# Patient Record
Sex: Male | Born: 1955 | Race: White | Hispanic: No | Marital: Married | Smoking: Never smoker
Health system: Southern US, Community
[De-identification: ages and names within clinical notes are randomized; demographics above are authoritative.]

## PROBLEM LIST (undated history)

## (undated) HISTORY — PX: CHOLECYSTECTOMY: SHX55

## (undated) HISTORY — PX: HERNIA REPAIR: SHX51

---

## 2013-04-21 ENCOUNTER — Encounter (HOSPITAL_BASED_OUTPATIENT_CLINIC_OR_DEPARTMENT_OTHER): Payer: Self-pay | Admitting: Emergency Medicine

## 2013-04-21 ENCOUNTER — Emergency Department (HOSPITAL_BASED_OUTPATIENT_CLINIC_OR_DEPARTMENT_OTHER): Payer: Worker's Compensation

## 2013-04-21 ENCOUNTER — Emergency Department (HOSPITAL_BASED_OUTPATIENT_CLINIC_OR_DEPARTMENT_OTHER)
Admission: EM | Admit: 2013-04-21 | Discharge: 2013-04-21 | Disposition: A | Discharge status: Home / Self Care | Payer: Worker's Compensation | Attending: Emergency Medicine | Admitting: Emergency Medicine

## 2013-04-21 DIAGNOSIS — S6990XA Unspecified injury of unspecified wrist, hand and finger(s), initial encounter: Secondary | ICD-10-CM | POA: Insufficient documentation

## 2013-04-21 DIAGNOSIS — Y9289 Other specified places as the place of occurrence of the external cause: Secondary | ICD-10-CM | POA: Insufficient documentation

## 2013-04-21 DIAGNOSIS — Y990 Civilian activity done for income or pay: Secondary | ICD-10-CM | POA: Insufficient documentation

## 2013-04-21 DIAGNOSIS — M25531 Pain in right wrist: Secondary | ICD-10-CM

## 2013-04-21 DIAGNOSIS — W010XXA Fall on same level from slipping, tripping and stumbling without subsequent striking against object, initial encounter: Secondary | ICD-10-CM | POA: Insufficient documentation

## 2013-04-21 DIAGNOSIS — X500XXA Overexertion from strenuous movement or load, initial encounter: Secondary | ICD-10-CM | POA: Insufficient documentation

## 2013-04-21 DIAGNOSIS — S59909A Unspecified injury of unspecified elbow, initial encounter: Secondary | ICD-10-CM | POA: Insufficient documentation

## 2013-04-21 DIAGNOSIS — Y9389 Activity, other specified: Secondary | ICD-10-CM | POA: Insufficient documentation

## 2013-04-21 DIAGNOSIS — S59919A Unspecified injury of unspecified forearm, initial encounter: Principal | ICD-10-CM

## 2013-04-21 MED ORDER — OXYCODONE-ACETAMINOPHEN 5-325 MG PO TABS: 1.0000 | ORAL_TABLET | Freq: Once | ORAL | Status: AC

## 2013-04-21 MED ORDER — OXYCODONE-ACETAMINOPHEN 5-325 MG PO TABS
1.0000 | ORAL_TABLET | Freq: Once | ORAL | Status: AC
  Administered 2013-04-21: 1 via ORAL
  Filled 2013-04-21: qty 1

## 2013-04-21 NOTE — ED Provider Notes (Signed)
Patient seen/examined in the Emergency Department in conjunction with [facility] Patient reports left wrist pain s/p fall Exam : tenderness/swelling to left wrist, distal neurovascularly intact Plan: xray negative but given findings will splint and advise repeat exam/xray in one week    Joya Gaskinsonald W Leotha Westermeyer, MD 04/21/13 1049

## 2013-04-21 NOTE — ED Provider Notes (Signed)
I have personally seen and examined the patient.  I have discussed the plan of care with the resident.  I have reviewed the documentation on PMH/FH/Soc. History.  I have reviewed the documentation of the resident and agree.   Joya Gaskinsonald W Halina Asano, MD 04/21/13 1225

## 2013-04-21 NOTE — ED Provider Notes (Signed)
CSN: [id number]     Arrival date & time 04/21/13  1000 History   First MD Initiated Contact with Patient 04/21/13 1007     Chief Complaint  Patient presents with  . Wrist Pain   HPI Abdirahim Flavell is 58 y.o. male who works in [employer] and tripped over pipe this morning about 8 AM. Patient reports his right hand caught underneath a pipe in his body-year-old over it. Wrist was pushed back into a flexion position during fall. Patient reports immediately turned "bluish" in his hand. He elevated it immediately and since coloration has been normal. Patient reports pain and swelling in right wrist only. Pain is increased with movement in both flexion and extension. No numbness or tingling in his fingers. Patient has taken nothing for the pain.   History reviewed. No pertinent past medical history. Past Surgical History  Procedure Laterality Date  . Hernia repair    . Cholecystectomy     No family history on file. History  Substance Use Topics  . Smoking status: Never Smoker   . Smokeless tobacco: Not on file  . Alcohol Use: No    Review of Systems  Musculoskeletal: Positive for joint swelling.   Negative, with the exception of above mentioned in HPI  Allergies  Review of patient's allergies indicates no known allergies.  Home Medications   Current Outpatient Rx  Name  Route  Sig  Dispense  Refill  . oxyCODONE-acetaminophen (PERCOCET/ROXICET) 5-325 MG per tablet   Oral   Take 1 tablet by mouth once.   15 tablet   0    BP 178/108  Pulse 90  Temp(Src) 98.9 F (37.2 C) (Oral)  Resp 16  Ht 6' (1.829 m)  Wt 200 lb (90.719 kg)  BMI 27.12 kg/m2  SpO2 98% Physical Exam Gen:  Appears in pain, holding wrist.  HEENT: AT. [place].Bilateral eyes without injections or icterus. MMM. .  CV: RRR, no murmur appreciated.  Chest: CTAB, no wheeze or crackles Abd: Soft.  NTND. BS pressent. No Masses palpated.  Ext: Moderate swelling of right wrist joint.  Pain with flexion and extension. ROM  normal, with pain. No TTP over metacarpals. Normal senation throughout hand. Good capillary refill in right hand.  TTP anatomical snuffbox. Scaphoid tenderness.  Skin: No rashes, purpura or petechiae. No lacerations or ecchymosis.  Neuro: Normal gait. PERLA. EOMi. Alert. Grossly intact.  .   ED Course  Procedures (including critical care time) Labs Review Labs Reviewed - No data to display Imaging Review Dg Wrist Complete Left  04/21/2013   CLINICAL DATA:  Fall off the roof, left wrist injury  EXAM: LEFT WRIST - COMPLETE 3+ VIEW  COMPARISON:  None.  FINDINGS: Four views of left wrist submitted. No acute fracture or subluxation. No radiopaque foreign body.  IMPRESSION: Negative.   Electronically Signed   By: Natasha Mead M.D.   On: 04/21/2013 10:29     EKG Interpretation None      MDM   Final diagnoses:  Right wrist pain   Patient presented with obvious swelling and pain of his right wrist after sustaining a fall at work. Tenderness over anatomical snuff box/scaphoid region. Xray today with no acute bony abnormality. Patient ws given percocet for pain and prescribed percocet #20. Patient was placed in short arm sugar tong splint and advised to follow up in 1 week, with repeat xray of the right wrist. Rest and no lifting restrictions were advised to patient. Patient in wife in full understanding and  agreement.    Natalia LeatherwoodRenee A Kuneff, DO 04/21/13 1133

## 2013-04-21 NOTE — Discharge Instructions (Signed)
Tendon Injury Tendons are strong, cordlike structures that connect muscle to bone. Tendons are made up of woven fibers, like a rope. A tendon injury is a tear (rupture) of the tendon. The rupture may be partial (only a few of the fibers in your tendon rupture) or complete (your entire tendon ruptures). CAUSES  Tendon injuries can be caused by high-stress activities, such as sports. They also can be caused by a repetitive injury or by a single injury from an excessive, rapid force. SYMPTOMS  Symptoms of tendon injury include pain when you move the joint close to the tendon. Other symptoms are swelling, redness, and warmth. DIAGNOSIS  Tendon injuries often can be diagnosed by physical exam. However, sometimes an X-ray exam or advanced imaging, such as magnetic resonance imaging (MRI), is necessary to determine the extent of the injury. TREATMENT  Partial tendon ruptures often can be treated with immobilization. A splint, bandage, or removable brace usually is used to immobilize the injured tendon. Most injured tendons need to be immobilized for 1 2 months before they are completely healed. Complete tendon ruptures may require surgical reattachment. Document Released: 03/12/2004 Document Revised: 01/22/2011 Document Reviewed: 04/26/2011 [facility] Patient Information 2014 [place], [place]. Wrist Fracture A wrist fracture is a break in one of the bones of the wrist. Your wrist is made up of several small bones at the palm of your hand (carpal bones) and the two bones that make up your forearm (radius and ulna). The bones come together to form multiple large and small joints. The shape and design of these joints allow your wrist to bend and straighten, move side-to-side, and rotate, as in twisting your palm up or down. CAUSES  A fracture may occur in any of the bones in your wrist when enough force is applied to the wrist, such as when falling down onto an outstretched hand. Severe injuries may occur from a  more forceful injury. SYMPTOMS Symptoms of wrist fractures include tenderness, bruising, and swelling. Additionally, the wrist may hang in an odd position or may be misshaped. DIAGNOSIS To diagnose a wrist fracture, your caregiver will physically examine your wrist. Your caregiver may also request an X-ray exam of your wrist. TREATMENT Treatment depends on many factors, including the nature and location of the fracture, your age, and your activity level. Treatment for wrist fracture can be nonsurgical or surgical. For nonsurgical treatment, a plaster cast or splint may be applied to your wrist if the bone is in a good position (aligned). The cast will stay on for about 6 weeks. If the alignment of your bone is not good, it may be necessary to realign (reduce) it. After the bone is reduced, a splint usually is placed on your wrist to allow for a small amount of normal swelling. After about 1 week, the splint is removed and a cast is added. The cast is removed 2 or 3 weeks later, after the swelling goes down, causing the cast to loosen. Another cast is applied. This cast is removed after about another 2 or 3 weeks, for a total of 4 to 6 weeks of immobilization. Sometimes the position of the bone is so far out of place that surgery is required to apply a device to hold it together as it heals. If the bone cannot be reduced without cutting the skin around the bone (closed reduction), a cut (incision) is made to allow direct access to the bone to reduce it (open reduction). Depending on the fracture, there are a number  of options for holding the bone in place while it heals, including a cast, metal pins, a plate and screws, and a device called an external fixator. With an external fixator, most of the hardware remains outside of the body. HOME CARE INSTRUCTIONS  To lessen swelling, keep your injured wrist elevated and move your fingers as much as possible.  Apply ice to your wrist for the first 1 to 2 days  after you have been treated or as directed by your caregiver. Applying ice helps to reduce inflammation and pain.  Put ice in a plastic bag.  Place a towel between your skin and the bag.  Leave the ice on for 15 to 20 minutes at a time, every 2 hours while you are awake.  Do not put pressure on any part of your cast or splint. It may break.  Use a plastic bag to protect your cast or splint from water while bathing or showering. Do not lower your cast or splint into water.  Only take over-the-counter or prescription medicines for pain as directed by your caregiver. SEEK IMMEDIATE MEDICAL CARE IF:   Your cast or splint gets damaged or breaks.  You have continued severe pain or more swelling than you did before the cast was put on.  Your skin or fingernails below the injury turn blue or gray or feel cold or numb.  You develop decreased feeling in your fingers. MAKE SURE YOU:  Understand these instructions.  Will watch your condition.  Will get help right away if you are not doing well or get worse. Document Released: 11/12/2004 Document Revised: 04/27/2011 Document Reviewed: 02/20/2011 [facility] Patient Information 2014 [place].

## 2013-04-21 NOTE — ED Notes (Signed)
Fall- Injury to left wrist

## 2013-04-26 NOTE — ED Notes (Signed)
Pt called requesting a work note. Pt sts wife Matthew Gilbert will pick up note.

## 2013-04-28 ENCOUNTER — Ambulatory Visit (INDEPENDENT_AMBULATORY_CARE_PROVIDER_SITE_OTHER): Payer: Worker's Compensation | Admitting: Family Medicine

## 2013-04-28 ENCOUNTER — Encounter: Payer: Self-pay | Admitting: Family Medicine

## 2013-04-28 ENCOUNTER — Ambulatory Visit (HOSPITAL_BASED_OUTPATIENT_CLINIC_OR_DEPARTMENT_OTHER)
Admission: RE | Admit: 2013-04-28 | Discharge: 2013-04-28 | Disposition: A | Discharge status: Home / Self Care | Payer: 59 | Source: Ambulatory Visit | Attending: Family Medicine | Admitting: Family Medicine

## 2013-04-28 VITALS — BP 160/100 | HR 61 | Ht 72.0 in | Wt 200.0 lb

## 2013-04-28 DIAGNOSIS — IMO0001 Reserved for inherently not codable concepts without codable children: Secondary | ICD-10-CM | POA: Insufficient documentation

## 2013-04-28 DIAGNOSIS — S59909A Unspecified injury of unspecified elbow, initial encounter: Secondary | ICD-10-CM

## 2013-04-28 DIAGNOSIS — S59919A Unspecified injury of unspecified forearm, initial encounter: Secondary | ICD-10-CM

## 2013-04-28 DIAGNOSIS — M25532 Pain in left wrist: Secondary | ICD-10-CM

## 2013-04-28 DIAGNOSIS — S6990XA Unspecified injury of unspecified wrist, hand and finger(s), initial encounter: Secondary | ICD-10-CM

## 2013-04-28 DIAGNOSIS — S6992XA Unspecified injury of left wrist, hand and finger(s), initial encounter: Secondary | ICD-10-CM

## 2013-04-28 DIAGNOSIS — M25539 Pain in unspecified wrist: Secondary | ICD-10-CM

## 2013-04-28 NOTE — Patient Instructions (Signed)
Your repeat x-rays were negative for a fracture. This is consistent with a severe, grade 3 wrist sprain. Wear wrist brace as often as possible, definitely during the day. Ok to take off to ice 15 minutes at a time 3-4 times a day. Ibuprofen 600mg  three times a day with food OR aleve 2 tabs twice a day with food for pain and inflammation. Elevate above the level of your heart as much as possible. No use of left arm for next 4 weeks - will likely be out of work as a result. Follow up in 4 weeks for reevaluation.

## 2013-05-02 ENCOUNTER — Encounter: Payer: Self-pay | Admitting: Family Medicine

## 2013-05-02 DIAGNOSIS — S6992XA Unspecified injury of left wrist, hand and finger(s), initial encounter: Secondary | ICD-10-CM | POA: Insufficient documentation

## 2013-05-02 NOTE — Assessment & Plan Note (Signed)
Repeat radiographs negative.  Consistent with severe wrist sprain.  Icing, nsaids, wrist brace.  No use of this arm for 4 weeks due to the injury.  F/u in 4 weeks for reevaluation.

## 2013-05-02 NOTE — Progress Notes (Signed)
Patient ID: Matthew Gilbert, male   DOB: 05/12/1955, 58 y.o.   MRN: [id number]  PCP: Toniann FailHAWKS,AL N, MD  Subjective:   HPI: Patient is a 58 y.o. male here for left wrist injury.  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 No prior injuries. + swelling and bruising. Radiographs in ED negative. Put in splint and referred here. Not taking anything for pain currently. Pain level 5/10. Right handed.  History reviewed. No pertinent past medical history.  Current Outpatient Prescriptions on File Prior to Visit  Medication Sig Dispense Refill  . oxyCODONE-acetaminophen (PERCOCET/ROXICET) 5-325 MG per tablet Take 1 tablet by mouth once.  15 tablet  0   No current facility-administered medications on file prior to visit.    Past Surgical History  Procedure Laterality Date  . Hernia repair    . Cholecystectomy      No Known Allergies  History   Social History  . Marital Status: Married    Spouse Name: N/A    Number of Children: N/A  . Years of Education: N/A   Occupational History  . Not on file.   Social History Main Topics  . Smoking status: Never Smoker   . Smokeless tobacco: Not on file  . Alcohol Use: No  . Drug Use: No  . Sexual Activity: Not on file   Other Topics Concern  . Not on file   Social History Narrative  . No narrative on file    Family History  Problem Relation Age of Onset  . Sudden death Neg Hx   . Hyperlipidemia Neg Hx   . Heart attack Neg Hx   . Diabetes Neg Hx   . Hypertension Neg Hx     BP 160/100  Pulse 61  Ht 6' (1.829 m)  Wt 200 lb (90.719 kg)  BMI 27.12 kg/m2  Review of Systems: See HPI above.    Objective:  Physical Exam:  Gen: NAD  Left hand/wrist: Mild swelling and bruising circumferentially about wrist. TTP wrist joint and distal radius.  No snuffbox tenderness.    ROM moderately limited all directions due to pain. Able to flex, extend, abduct digits. Sensation intact to light touch distally with < 2 sec cap refill.    Assessment & Plan:  1. Left wrist injury - Repeat radiographs negative.  Consistent with severe wrist sprain.  Icing, nsaids, wrist brace.  No use of this arm for 4 weeks due to the injury.  F/u in 4 weeks for reevaluation.

## 2013-05-25 ENCOUNTER — Ambulatory Visit (HOSPITAL_BASED_OUTPATIENT_CLINIC_OR_DEPARTMENT_OTHER)
Admission: RE | Admit: 2013-05-25 | Discharge: 2013-05-25 | Disposition: A | Discharge status: Home / Self Care | Payer: 59 | Source: Ambulatory Visit | Attending: Family Medicine | Admitting: Family Medicine

## 2013-05-25 ENCOUNTER — Encounter: Payer: Self-pay | Admitting: Family Medicine

## 2013-05-25 ENCOUNTER — Ambulatory Visit (INDEPENDENT_AMBULATORY_CARE_PROVIDER_SITE_OTHER): Payer: Worker's Compensation | Admitting: Family Medicine

## 2013-05-25 VITALS — BP 143/90 | HR 66 | Ht 72.0 in | Wt 200.0 lb

## 2013-05-25 DIAGNOSIS — S59909A Unspecified injury of unspecified elbow, initial encounter: Secondary | ICD-10-CM

## 2013-05-25 DIAGNOSIS — M25532 Pain in left wrist: Secondary | ICD-10-CM

## 2013-05-25 DIAGNOSIS — W19XXXA Unspecified fall, initial encounter: Secondary | ICD-10-CM | POA: Insufficient documentation

## 2013-05-25 DIAGNOSIS — S59919A Unspecified injury of unspecified forearm, initial encounter: Secondary | ICD-10-CM

## 2013-05-25 DIAGNOSIS — M25539 Pain in unspecified wrist: Secondary | ICD-10-CM

## 2013-05-25 DIAGNOSIS — S52609A Unspecified fracture of lower end of unspecified ulna, initial encounter for closed fracture: Secondary | ICD-10-CM | POA: Insufficient documentation

## 2013-05-25 DIAGNOSIS — S6990XA Unspecified injury of unspecified wrist, hand and finger(s), initial encounter: Secondary | ICD-10-CM

## 2013-05-25 DIAGNOSIS — M7989 Other specified soft tissue disorders: Secondary | ICD-10-CM | POA: Insufficient documentation

## 2013-05-25 DIAGNOSIS — S6992XA Unspecified injury of left wrist, hand and finger(s), initial encounter: Secondary | ICD-10-CM

## 2013-05-25 NOTE — Patient Instructions (Signed)
Get repeat x-rays before you leave today. If these are normal we will go ahead with an MRI of your wrist to assess for a chauffeurs fracture or scapholunate dissociation given limited function, severe swelling, and tenderness.

## 2013-05-26 ENCOUNTER — Ambulatory Visit: Payer: Self-pay | Admitting: Family Medicine

## 2013-05-26 ENCOUNTER — Encounter: Payer: Self-pay | Admitting: Family Medicine

## 2013-05-26 NOTE — Progress Notes (Addendum)
Patient ID: Matthew Gilbert, male   DOB: 07/20/1955, 58 y.o.   MRN: [id number]  PCP: Matthew Gilbert,Matthew N, MD  Subjective:   HPI: Patient is a 58 y.o. male here for left wrist injury.  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 No prior injuries. + swelling and bruising. Radiographs in ED negative. Put in splint and referred here. Not taking anything for pain currently. Pain level 5/10. Right handed.  4/9: Patient reports he feels better when brace is on. But still with a lot of swelling, minimal motion, quite painful without brace on. Not taking percocet.  History reviewed. No pertinent past medical history.  Current Outpatient Prescriptions on File Prior to Visit  Medication Sig Dispense Refill  . oxyCODONE-acetaminophen (PERCOCET/ROXICET) 5-325 MG per tablet Take 1 tablet by mouth once.  15 tablet  0   No current facility-administered medications on file prior to visit.    Past Surgical History  Procedure Laterality Date  . Hernia repair    . Cholecystectomy      No Known Allergies  History   Social History  . Marital Status: Married    Spouse Name: Gilbert/A    Number of Children: Gilbert/A  . Years of Education: Gilbert/A   Occupational History  . Not on file.   Social History Main Topics  . Smoking status: Never Smoker   . Smokeless tobacco: Not on file  . Alcohol Use: No  . Drug Use: No  . Sexual Activity: Not on file   Other Topics Concern  . Not on file   Social History Narrative  . No narrative on file    Family History  Problem Relation Age of Onset  . Sudden death Neg Hx   . Hyperlipidemia Neg Hx   . Heart attack Neg Hx   . Diabetes Neg Hx   . Hypertension Neg Hx     BP 143/90  Pulse 66  Ht 6' (1.829 m)  Wt 200 lb (90.719 kg)  BMI 27.12 kg/m2  Review of Systems: See HPI above.    Objective:   Physical Exam:  Gen: NAD  Left hand/wrist: Mild-moderate swelling and bruising circumferentially about wrist. TTP wrist joint and distal radius.  No snuffbox tenderness.  No ulnar styloid tenderness. ROM moderately limited all directions due to pain. Able to flex, extend, abduct digits. Sensation intact to light touch distally with < 2 sec cap refill.    Assessment & Plan:  1. Left wrist injury - Today's radiographs show ulnar styloid fracture though on my review this is very subtle if present and patient does not have tenderness here.  All his pain, tenderness, persistent swelling despite bracing concerning for occult radius fracture (chauffeurs fracture) or scapholunate dissociation.  Will move forward with MRI to further assess.  Addendum:  MRI obtained, reviewed, and discussed with patient.  Unfortunately he has a near complete scapholunate ligament tear.  I suspect he will need operative intervention for this - we will go ahead with hand surgery referral.

## 2013-05-26 NOTE — Assessment & Plan Note (Signed)
Today's radiographs show ulnar styloid fracture though on my review this is very subtle if present and patient does not have tenderness here.  All his pain, tenderness, persistent swelling despite bracing concerning for occult radius fracture (chauffeurs fracture) or scapholunate dissociation.  Will move forward with MRI to further assess.

## 2013-05-30 ENCOUNTER — Encounter: Payer: Self-pay | Admitting: *Deleted

## 2013-06-16 ENCOUNTER — Other Ambulatory Visit: Payer: Self-pay | Admitting: *Deleted

## 2013-06-16 DIAGNOSIS — S6992XA Unspecified injury of left wrist, hand and finger(s), initial encounter: Secondary | ICD-10-CM

## 2013-06-20 ENCOUNTER — Telehealth: Payer: Self-pay | Admitting: Family Medicine

## 2013-06-20 ENCOUNTER — Encounter: Payer: Self-pay | Admitting: Family Medicine

## 2013-06-20 NOTE — Telephone Encounter (Signed)
Done - see letter.

## 2015-03-02 IMAGING — CR DG WRIST COMPLETE 3+V*L*
4 series · 4 of 4 positions shown · non-contrast
Comparison: April 21, 2013

CLINICAL DATA: Follow-up distal radial fracture.

EXAM:
LEFT WRIST - COMPLETE 3+ VIEW

[x wrist pa left]
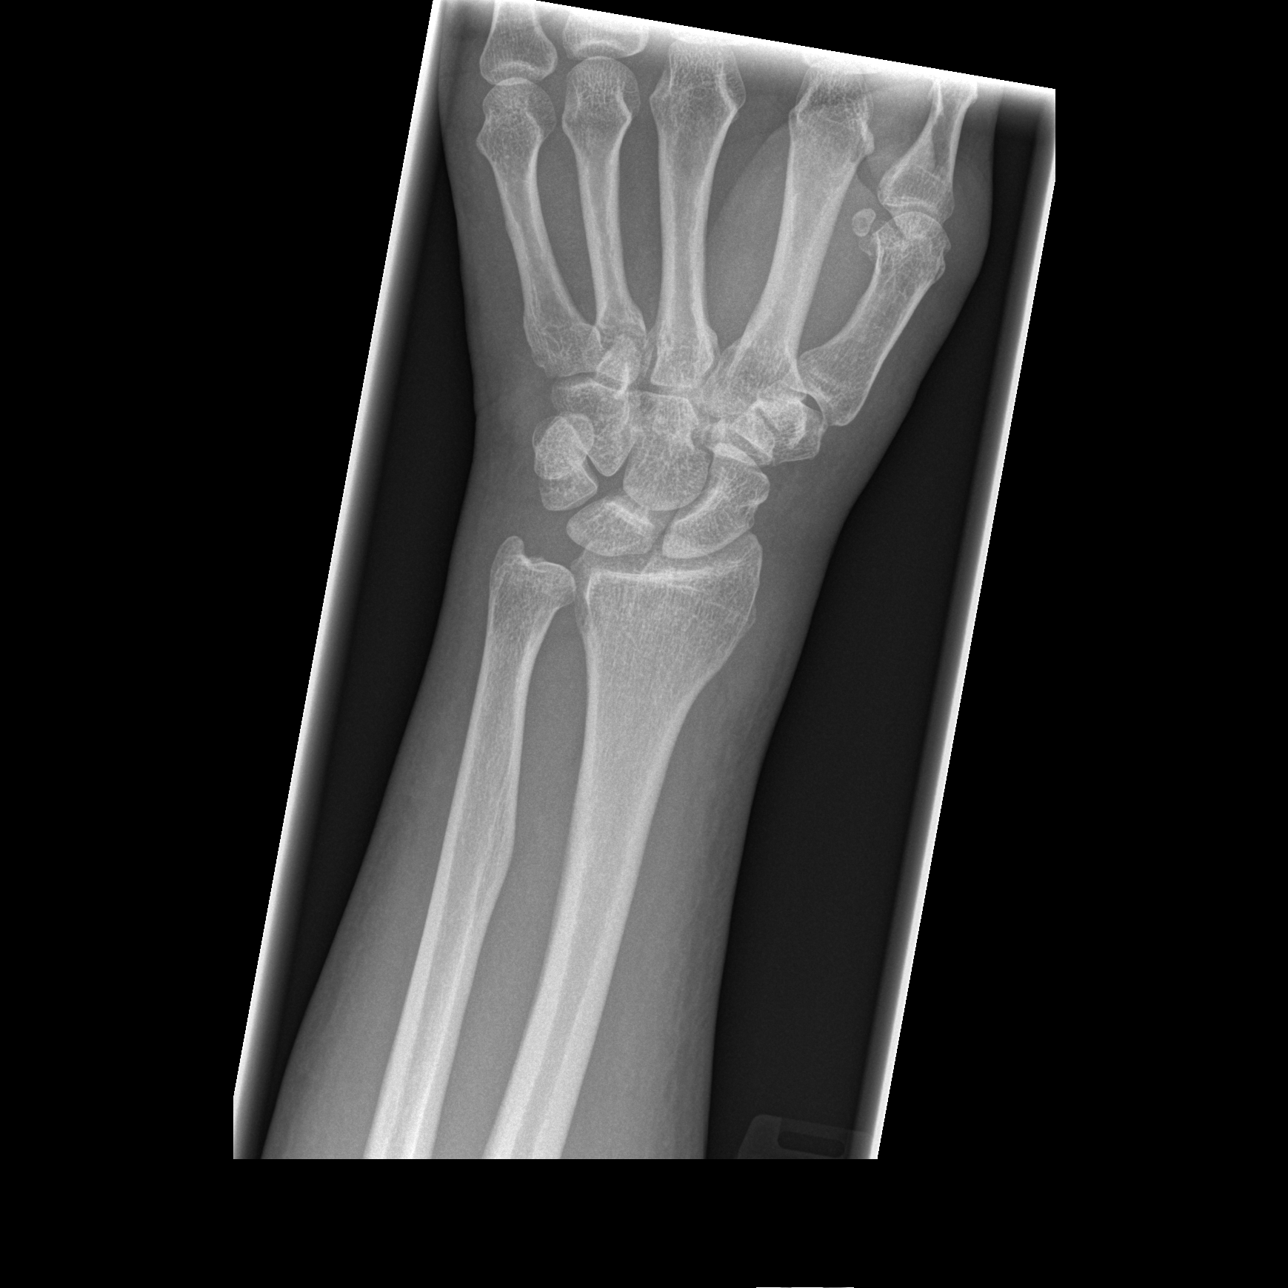

[x wrist obl left]
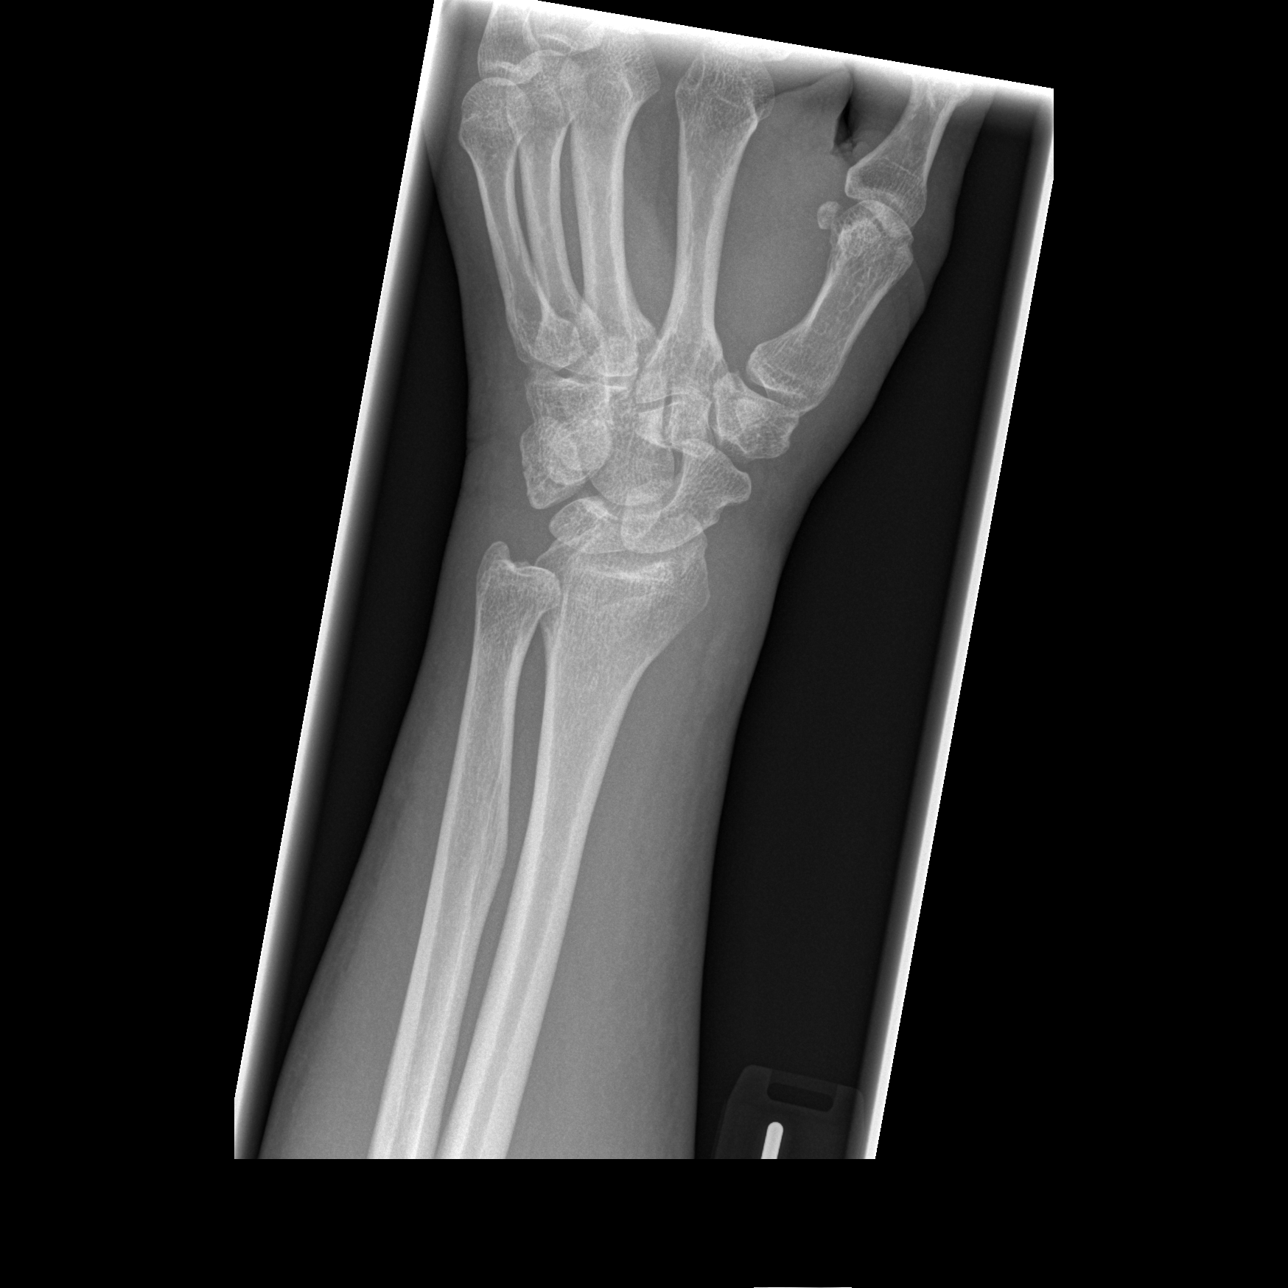

[x wrist lat left]
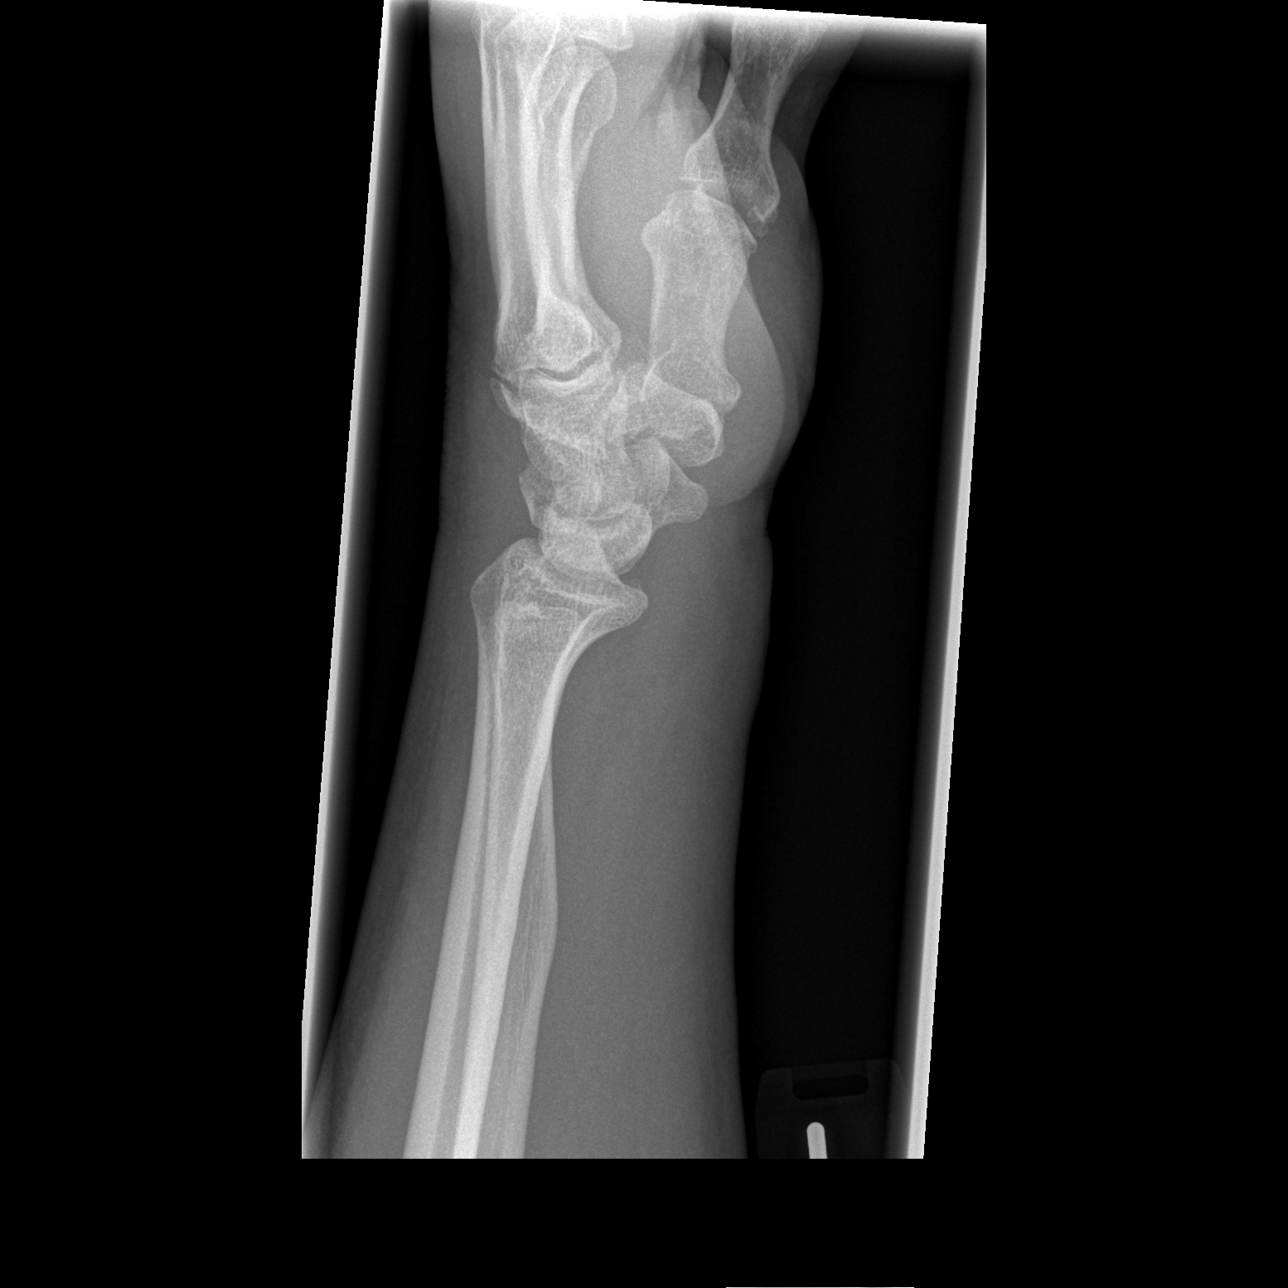

[x navicular]
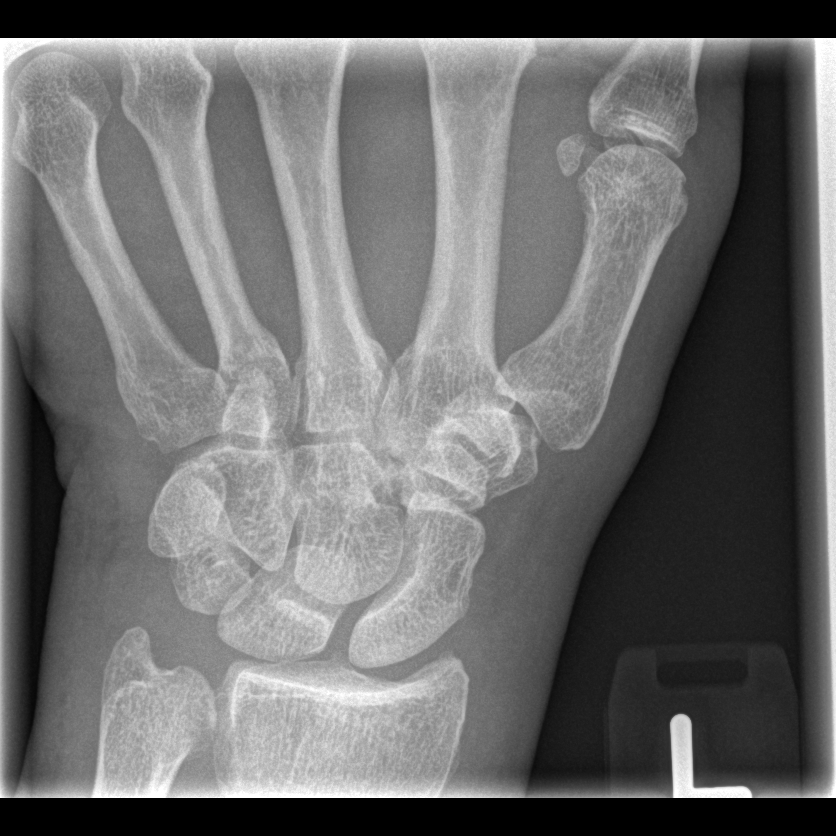

[4 of 4 positions shown; findings below may reference images not displayed]

FINDINGS: No definite acute displaced fracture or dislocation is identified.
There is no evidence of arthropathy or other focal bone abnormality.
Soft tissues are unremarkable.
IMPRESSION: No acute displaced fracture or dislocation noted.
